# Patient Record
Sex: Male | Born: 1975 | Race: Black or African American | Hispanic: No | Marital: Single | Smoking: Current every day smoker
Health system: Southern US, Community
[De-identification: ages and names within clinical notes are randomized; demographics above are authoritative.]

## PROBLEM LIST (undated history)

## (undated) ENCOUNTER — Emergency Department (HOSPITAL_COMMUNITY): Admission: EM | Payer: Self-pay | Source: Home / Self Care

## (undated) DIAGNOSIS — W3400XA Accidental discharge from unspecified firearms or gun, initial encounter: Secondary | ICD-10-CM

## (undated) DIAGNOSIS — M25562 Pain in left knee: Secondary | ICD-10-CM

## (undated) DIAGNOSIS — M7989 Other specified soft tissue disorders: Secondary | ICD-10-CM

## (undated) DIAGNOSIS — M79662 Pain in left lower leg: Secondary | ICD-10-CM

## (undated) HISTORY — DX: Other specified soft tissue disorders: M79.89

## (undated) HISTORY — DX: Pain in left knee: M25.562

## (undated) HISTORY — DX: Pain in left lower leg: M79.662

---

## 2003-11-19 ENCOUNTER — Emergency Department (HOSPITAL_COMMUNITY): Admission: EM | Admit: 2003-11-19 | Discharge: 2003-11-19 | Payer: Self-pay | Admitting: *Deleted

## 2004-03-11 ENCOUNTER — Emergency Department (HOSPITAL_COMMUNITY): Admission: EM | Admit: 2004-03-11 | Discharge: 2004-03-12 | Payer: Self-pay | Admitting: Emergency Medicine

## 2004-03-17 ENCOUNTER — Emergency Department (HOSPITAL_COMMUNITY): Admission: EM | Admit: 2004-03-17 | Discharge: 2004-03-17 | Payer: Self-pay | Admitting: Emergency Medicine

## 2004-03-19 ENCOUNTER — Emergency Department (HOSPITAL_COMMUNITY): Admission: EM | Admit: 2004-03-19 | Discharge: 2004-03-19 | Payer: Self-pay | Admitting: Emergency Medicine

## 2004-06-24 ENCOUNTER — Emergency Department (HOSPITAL_COMMUNITY): Admission: EM | Admit: 2004-06-24 | Discharge: 2004-06-24 | Payer: Self-pay | Admitting: Emergency Medicine

## 2004-08-22 ENCOUNTER — Emergency Department (HOSPITAL_COMMUNITY): Admission: EM | Admit: 2004-08-22 | Discharge: 2004-08-22 | Payer: Self-pay | Admitting: Emergency Medicine

## 2006-03-16 ENCOUNTER — Emergency Department (HOSPITAL_COMMUNITY): Admission: EM | Admit: 2006-03-16 | Discharge: 2006-03-16 | Payer: Self-pay | Admitting: Emergency Medicine

## 2006-07-23 ENCOUNTER — Emergency Department (HOSPITAL_COMMUNITY): Admission: EM | Admit: 2006-07-23 | Discharge: 2006-07-23 | Payer: Self-pay | Admitting: Emergency Medicine

## 2006-09-28 ENCOUNTER — Emergency Department (HOSPITAL_COMMUNITY): Admission: EM | Admit: 2006-09-28 | Discharge: 2006-09-28 | Payer: Self-pay | Admitting: Emergency Medicine

## 2008-06-13 ENCOUNTER — Emergency Department (HOSPITAL_COMMUNITY): Admission: EM | Admit: 2008-06-13 | Discharge: 2008-06-13 | Payer: Self-pay | Admitting: Emergency Medicine

## 2009-01-24 ENCOUNTER — Emergency Department (HOSPITAL_COMMUNITY): Admission: EM | Admit: 2009-01-24 | Discharge: 2009-01-24 | Payer: Self-pay | Admitting: Emergency Medicine

## 2009-05-20 ENCOUNTER — Emergency Department (HOSPITAL_COMMUNITY): Admission: EM | Admit: 2009-05-20 | Discharge: 2009-05-21 | Payer: Self-pay | Admitting: Emergency Medicine

## 2009-05-24 ENCOUNTER — Encounter: Admission: RE | Admit: 2009-05-24 | Discharge: 2009-07-17 | Payer: Self-pay | Admitting: Orthopedic Surgery

## 2009-06-07 ENCOUNTER — Emergency Department (HOSPITAL_COMMUNITY): Admission: EM | Admit: 2009-06-07 | Discharge: 2009-06-07 | Payer: Self-pay | Admitting: Emergency Medicine

## 2009-07-09 ENCOUNTER — Encounter (INDEPENDENT_AMBULATORY_CARE_PROVIDER_SITE_OTHER): Payer: Self-pay | Admitting: Emergency Medicine

## 2009-07-09 ENCOUNTER — Ambulatory Visit: Payer: Self-pay | Admitting: Surgery

## 2009-07-09 ENCOUNTER — Emergency Department (HOSPITAL_COMMUNITY): Admission: EM | Admit: 2009-07-09 | Discharge: 2009-07-09 | Payer: Self-pay | Admitting: Emergency Medicine

## 2010-06-23 ENCOUNTER — Inpatient Hospital Stay (HOSPITAL_COMMUNITY)
Admission: EM | Admit: 2010-06-23 | Discharge: 2010-06-26 | Payer: Self-pay | Source: Home / Self Care | Admitting: Emergency Medicine

## 2010-06-25 ENCOUNTER — Ambulatory Visit: Payer: Self-pay | Admitting: Physical Medicine & Rehabilitation

## 2010-07-11 ENCOUNTER — Ambulatory Visit (HOSPITAL_COMMUNITY): Admission: RE | Admit: 2010-07-11 | Discharge: 2010-07-11 | Payer: Self-pay | Admitting: Urology

## 2010-09-21 ENCOUNTER — Emergency Department (HOSPITAL_COMMUNITY)
Admission: EM | Admit: 2010-09-21 | Discharge: 2010-09-21 | Payer: Self-pay | Source: Home / Self Care | Admitting: Emergency Medicine

## 2010-09-22 LAB — URINALYSIS, ROUTINE W REFLEX MICROSCOPIC
Bilirubin Urine: NEGATIVE
Hgb urine dipstick: NEGATIVE
Ketones, ur: NEGATIVE mg/dL
Nitrite: NEGATIVE
Protein, ur: NEGATIVE mg/dL
Specific Gravity, Urine: 1.028 (ref 1.005–1.030)
Urine Glucose, Fasting: NEGATIVE mg/dL
Urobilinogen, UA: 1 mg/dL (ref 0.0–1.0)
pH: 6.5 (ref 5.0–8.0)

## 2010-09-22 LAB — DIFFERENTIAL
Basophils Absolute: 0 10*3/uL (ref 0.0–0.1)
Basophils Relative: 0 % (ref 0–1)
Eosinophils Absolute: 0.2 10*3/uL (ref 0.0–0.7)
Eosinophils Relative: 2 % (ref 0–5)
Lymphocytes Relative: 20 % (ref 12–46)
Lymphs Abs: 1.5 10*3/uL (ref 0.7–4.0)
Monocytes Absolute: 0.6 10*3/uL (ref 0.1–1.0)
Monocytes Relative: 8 % (ref 3–12)
Neutro Abs: 5.1 10*3/uL (ref 1.7–7.7)
Neutrophils Relative %: 69 % (ref 43–77)

## 2010-09-22 LAB — POCT I-STAT, CHEM 8
BUN: 11 mg/dL (ref 6–23)
Calcium, Ion: 1.16 mmol/L (ref 1.12–1.32)
Chloride: 104 mEq/L (ref 96–112)
Creatinine, Ser: 1.1 mg/dL (ref 0.4–1.5)
Glucose, Bld: 82 mg/dL (ref 70–99)
HCT: 42 % (ref 39.0–52.0)
Hemoglobin: 14.3 g/dL (ref 13.0–17.0)
Potassium: 3.6 mEq/L (ref 3.5–5.1)
Sodium: 143 mEq/L (ref 135–145)
TCO2: 29 mmol/L (ref 0–100)

## 2010-09-22 LAB — URINE MICROSCOPIC-ADD ON

## 2010-09-22 LAB — CBC
HCT: 40.9 % (ref 39.0–52.0)
Hemoglobin: 13.7 g/dL (ref 13.0–17.0)
MCH: 31.4 pg (ref 26.0–34.0)
MCHC: 33.5 g/dL (ref 30.0–36.0)
MCV: 93.6 fL (ref 78.0–100.0)
Platelets: 296 10*3/uL (ref 150–400)
RBC: 4.37 MIL/uL (ref 4.22–5.81)
RDW: 13.4 % (ref 11.5–15.5)
WBC: 7.4 10*3/uL (ref 4.0–10.5)

## 2010-11-19 LAB — BASIC METABOLIC PANEL
BUN: 7 mg/dL (ref 6–23)
Chloride: 102 mEq/L (ref 96–112)
Creatinine, Ser: 0.74 mg/dL (ref 0.4–1.5)
Glucose, Bld: 102 mg/dL — ABNORMAL HIGH (ref 70–99)
Potassium: 3.8 mEq/L (ref 3.5–5.1)

## 2010-11-19 LAB — CBC
HCT: 30.9 % — ABNORMAL LOW (ref 39.0–52.0)
HCT: 38.1 % — ABNORMAL LOW (ref 39.0–52.0)
Hemoglobin: 13.1 g/dL (ref 13.0–17.0)
MCH: 31.1 pg (ref 26.0–34.0)
MCH: 31.5 pg (ref 26.0–34.0)
MCH: 32.2 pg (ref 26.0–34.0)
MCHC: 33.3 g/dL (ref 30.0–36.0)
MCHC: 34 g/dL (ref 30.0–36.0)
MCHC: 34.4 g/dL (ref 30.0–36.0)
MCV: 93.4 fL (ref 78.0–100.0)
MCV: 93.6 fL (ref 78.0–100.0)
Platelets: 178 10*3/uL (ref 150–400)
Platelets: 188 10*3/uL (ref 150–400)
Platelets: 248 10*3/uL (ref 150–400)
RBC: 4.07 MIL/uL — ABNORMAL LOW (ref 4.22–5.81)
RDW: 12.6 % (ref 11.5–15.5)
RDW: 12.8 % (ref 11.5–15.5)
RDW: 13 % (ref 11.5–15.5)
WBC: 10.5 10*3/uL (ref 4.0–10.5)
WBC: 8.3 10*3/uL (ref 4.0–10.5)

## 2010-11-19 LAB — TYPE AND SCREEN
ABO/RH(D): O POS
Antibody Screen: NEGATIVE

## 2010-11-19 LAB — URINE MICROSCOPIC-ADD ON

## 2010-11-19 LAB — MRSA PCR SCREENING: MRSA by PCR: NEGATIVE

## 2010-11-19 LAB — DIFFERENTIAL
Basophils Absolute: 0 10*3/uL (ref 0.0–0.1)
Basophils Relative: 0 % (ref 0–1)
Eosinophils Absolute: 0.4 10*3/uL (ref 0.0–0.7)
Eosinophils Relative: 4 % (ref 0–5)
Lymphocytes Relative: 29 % (ref 12–46)
Lymphs Abs: 3 10*3/uL (ref 0.7–4.0)
Monocytes Absolute: 0.7 10*3/uL (ref 0.1–1.0)
Monocytes Relative: 6 % (ref 3–12)
Neutro Abs: 6.4 10*3/uL (ref 1.7–7.7)
Neutrophils Relative %: 61 % (ref 43–77)

## 2010-11-19 LAB — URINALYSIS, ROUTINE W REFLEX MICROSCOPIC
Bilirubin Urine: NEGATIVE
Glucose, UA: NEGATIVE mg/dL
Ketones, ur: 15 mg/dL — AB
Nitrite: NEGATIVE
Protein, ur: 100 mg/dL — AB
Specific Gravity, Urine: 1.01 (ref 1.005–1.030)
Urobilinogen, UA: 0.2 mg/dL (ref 0.0–1.0)
pH: 5.5 (ref 5.0–8.0)

## 2010-11-19 LAB — POCT I-STAT, CHEM 8
BUN: 14 mg/dL (ref 6–23)
Calcium, Ion: 1.09 mmol/L — ABNORMAL LOW (ref 1.12–1.32)
Chloride: 106 mEq/L (ref 96–112)
HCT: 42 % (ref 39.0–52.0)
Sodium: 142 mEq/L (ref 135–145)
TCO2: 23 mmol/L (ref 0–100)

## 2010-11-19 LAB — CREATININE, FLUID (PLEURAL, PERITONEAL, JP DRAINAGE): Creat, Fluid: 0.8 mg/dL

## 2010-12-10 LAB — SYNOVIAL CELL COUNT + DIFF, W/ CRYSTALS
Eosinophils-Synovial: 0 % (ref 0–1)
Lymphocytes-Synovial Fld: 17 % (ref 0–20)
Monocyte-Macrophage-Synovial Fluid: 75 % (ref 50–90)
Neutrophil, Synovial: 8 % (ref 0–25)
Other Cells-SYN: 0
WBC, Synovial: 585 /mm3 — ABNORMAL HIGH (ref 0–200)

## 2010-12-10 LAB — GRAM STAIN

## 2010-12-12 LAB — DIFFERENTIAL
Lymphocytes Relative: 47 % — ABNORMAL HIGH (ref 12–46)
Lymphs Abs: 3.2 10*3/uL (ref 0.7–4.0)
Monocytes Relative: 8 % (ref 3–12)
Neutrophils Relative %: 41 % — ABNORMAL LOW (ref 43–77)

## 2010-12-12 LAB — BASIC METABOLIC PANEL
BUN: 8 mg/dL (ref 6–23)
Creatinine, Ser: 1.27 mg/dL (ref 0.4–1.5)
GFR calc Af Amer: >60 mL/min (ref >60)
GFR calc non Af Amer: >60 mL/min (ref >60)
Potassium: 2.7 mEq/L — CL (ref 3.5–5.1)

## 2010-12-12 LAB — URINALYSIS, ROUTINE W REFLEX MICROSCOPIC
Glucose, UA: NEGATIVE mg/dL
Specific Gravity, Urine: 1.038 — ABNORMAL HIGH (ref 1.005–1.030)
pH: 6 (ref 5.0–8.0)

## 2010-12-12 LAB — CROSSMATCH: Antibody Screen: NEGATIVE

## 2010-12-12 LAB — CBC
HCT: 38.3 % — ABNORMAL LOW (ref 39.0–52.0)
Platelets: 266 10*3/uL (ref 150–400)
RBC: 3.93 MIL/uL — ABNORMAL LOW (ref 4.22–5.81)
WBC: 6.8 10*3/uL (ref 4.0–10.5)

## 2010-12-12 LAB — RAPID URINE DRUG SCREEN, HOSP PERFORMED
Barbiturates: NOT DETECTED
Benzodiazepines: NOT DETECTED
Cocaine: NOT DETECTED
Opiates: POSITIVE — AB

## 2010-12-12 LAB — ABO/RH: ABO/RH(D): O POS
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
## 2017-02-12 ENCOUNTER — Ambulatory Visit (HOSPITAL_COMMUNITY)
Admission: RE | Admit: 2017-02-12 | Discharge: 2017-02-12 | Disposition: A | Discharge status: Home / Self Care | Payer: Self-pay | Source: Ambulatory Visit | Attending: Physician Assistant | Admitting: Physician Assistant

## 2017-02-12 ENCOUNTER — Encounter: Payer: Self-pay | Admitting: Physician Assistant

## 2017-02-12 ENCOUNTER — Other Ambulatory Visit: Payer: Self-pay | Admitting: Physician Assistant

## 2017-02-12 DIAGNOSIS — M25562 Pain in left knee: Secondary | ICD-10-CM | POA: Insufficient documentation

## 2017-02-12 DIAGNOSIS — M79662 Pain in left lower leg: Secondary | ICD-10-CM

## 2017-02-12 DIAGNOSIS — M7989 Other specified soft tissue disorders: Secondary | ICD-10-CM | POA: Insufficient documentation

## 2017-02-12 HISTORY — DX: Pain in left lower leg: M79.662

## 2017-02-12 HISTORY — DX: Pain in left knee: M25.562

## 2017-02-12 HISTORY — DX: Other specified soft tissue disorders: M79.89

## 2017-02-12 NOTE — Progress Notes (Signed)
*  Preliminary Results* Left lower extremity venous duplex completed. Left lower extremity is negative for deep vein thrombosis. There is no evidence of left Baker's cyst.  02/12/2017 6:25 PM  Gertie FeyMichelle Braydon Kullman, BS, RVT, RDCS, RDMS

## 2018-12-04 ENCOUNTER — Emergency Department (HOSPITAL_COMMUNITY)
Admission: EM | Admit: 2018-12-04 | Discharge: 2018-12-04 | Disposition: A | Discharge status: Home / Self Care | Payer: Self-pay | Attending: Emergency Medicine | Admitting: Emergency Medicine

## 2018-12-04 ENCOUNTER — Encounter (HOSPITAL_COMMUNITY): Payer: Self-pay | Admitting: Emergency Medicine

## 2018-12-04 ENCOUNTER — Other Ambulatory Visit: Payer: Self-pay

## 2018-12-04 ENCOUNTER — Emergency Department (HOSPITAL_COMMUNITY): Payer: Self-pay

## 2018-12-04 DIAGNOSIS — M62838 Other muscle spasm: Secondary | ICD-10-CM | POA: Insufficient documentation

## 2018-12-04 DIAGNOSIS — M25511 Pain in right shoulder: Secondary | ICD-10-CM | POA: Insufficient documentation

## 2018-12-04 HISTORY — DX: Accidental discharge from unspecified firearms or gun, initial encounter: W34.00XA

## 2018-12-04 MED ORDER — CYCLOBENZAPRINE HCL 10 MG PO TABS: 10.0000 mg | ORAL_TABLET | Freq: Two times a day (BID) | ORAL | 0 refills | Status: AC | PRN

## 2018-12-04 MED ORDER — CYCLOBENZAPRINE HCL 10 MG PO TABS
10.0000 mg | ORAL_TABLET | Freq: Once | ORAL | Status: AC
  Administered 2018-12-04: 10 mg via ORAL
  Filled 2018-12-04: qty 1

## 2018-12-04 NOTE — ED Notes (Signed)
Patient transported to X-ray 

## 2018-12-04 NOTE — ED Triage Notes (Addendum)
Pt c/o R arm pain/numbness/weakness x 4 days after playing basketball. Pt reports he had same issue last year, was told he had arthritis. +etoh, Neg Zenaida Niece

## 2018-12-04 NOTE — Discharge Instructions (Signed)
Your history and exam are consistent with musculoskeletal pain with muscle spasms we palpated.  Your x-ray showed no fracture, dislocation, rib abnormality, or other significant abnormalities.  I suspect your pain is related to your joint in your shoulder and your muscles.  As the pain began to improve with the muscle relaxant, and the muscle spasms we felt, please use the muscle relaxants for the next week to help with the symptoms.  Please follow-up with sports medicine for further management.  Please use the arm sling to help minimize movement of the shoulder for the next few days.  If any symptoms change or worsen, please return to the nearest emergency department.
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
## 2019-07-01 ENCOUNTER — Encounter (HOSPITAL_COMMUNITY): Payer: Self-pay

## 2019-07-01 ENCOUNTER — Emergency Department (HOSPITAL_COMMUNITY): Payer: Self-pay

## 2019-07-01 ENCOUNTER — Emergency Department (HOSPITAL_COMMUNITY)
Admission: EM | Admit: 2019-07-01 | Discharge: 2019-07-01 | Disposition: A | Discharge status: Home / Self Care | Payer: Self-pay | Attending: Emergency Medicine | Admitting: Emergency Medicine

## 2019-07-01 ENCOUNTER — Other Ambulatory Visit: Payer: Self-pay

## 2019-07-01 DIAGNOSIS — Y929 Unspecified place or not applicable: Secondary | ICD-10-CM | POA: Insufficient documentation

## 2019-07-01 DIAGNOSIS — Y939 Activity, unspecified: Secondary | ICD-10-CM | POA: Insufficient documentation

## 2019-07-01 DIAGNOSIS — F17210 Nicotine dependence, cigarettes, uncomplicated: Secondary | ICD-10-CM | POA: Insufficient documentation

## 2019-07-01 DIAGNOSIS — Y999 Unspecified external cause status: Secondary | ICD-10-CM | POA: Insufficient documentation

## 2019-07-01 DIAGNOSIS — S8002XA Contusion of left knee, initial encounter: Secondary | ICD-10-CM | POA: Insufficient documentation

## 2019-07-01 DIAGNOSIS — W19XXXA Unspecified fall, initial encounter: Secondary | ICD-10-CM | POA: Insufficient documentation
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
## 2022-05-19 DIAGNOSIS — R040 Epistaxis: Secondary | ICD-10-CM | POA: Diagnosis not present

## 2022-05-19 DIAGNOSIS — I10 Essential (primary) hypertension: Secondary | ICD-10-CM | POA: Diagnosis not present
